# Patient Record
Sex: Male | Born: 1970 | Race: Black or African American | Hispanic: No | Smoking: Former smoker
Health system: Southern US, Community
[De-identification: ages and names within clinical notes are randomized; demographics above are authoritative.]

## PROBLEM LIST (undated history)

## (undated) DIAGNOSIS — D869 Sarcoidosis, unspecified: Secondary | ICD-10-CM

## (undated) DIAGNOSIS — G4733 Obstructive sleep apnea (adult) (pediatric): Secondary | ICD-10-CM

## (undated) DIAGNOSIS — E669 Obesity, unspecified: Secondary | ICD-10-CM

---

## 2015-09-02 ENCOUNTER — Observation Stay (HOSPITAL_COMMUNITY): Payer: Non-veteran care | Admitting: Anesthesiology

## 2015-09-02 ENCOUNTER — Observation Stay (HOSPITAL_COMMUNITY)
Admission: EM | Admit: 2015-09-02 | Discharge: 2015-09-03 | Disposition: A | Discharge status: Home / Self Care | Payer: Non-veteran care | Attending: Surgery | Admitting: Surgery

## 2015-09-02 ENCOUNTER — Encounter (HOSPITAL_COMMUNITY)
Admission: EM | Disposition: A | Discharge status: Home / Self Care | Payer: Self-pay | Source: Home / Self Care | Attending: Emergency Medicine

## 2015-09-02 ENCOUNTER — Emergency Department (HOSPITAL_COMMUNITY): Payer: Non-veteran care

## 2015-09-02 ENCOUNTER — Encounter (HOSPITAL_COMMUNITY): Payer: Self-pay

## 2015-09-02 DIAGNOSIS — Z791 Long term (current) use of non-steroidal anti-inflammatories (NSAID): Secondary | ICD-10-CM | POA: Insufficient documentation

## 2015-09-02 DIAGNOSIS — Z6841 Body Mass Index (BMI) 40.0 and over, adult: Secondary | ICD-10-CM | POA: Insufficient documentation

## 2015-09-02 DIAGNOSIS — R109 Unspecified abdominal pain: Secondary | ICD-10-CM | POA: Diagnosis present

## 2015-09-02 DIAGNOSIS — Z7952 Long term (current) use of systemic steroids: Secondary | ICD-10-CM | POA: Insufficient documentation

## 2015-09-02 DIAGNOSIS — E669 Obesity, unspecified: Secondary | ICD-10-CM | POA: Diagnosis present

## 2015-09-02 DIAGNOSIS — G4733 Obstructive sleep apnea (adult) (pediatric): Secondary | ICD-10-CM | POA: Diagnosis not present

## 2015-09-02 DIAGNOSIS — Z87891 Personal history of nicotine dependence: Secondary | ICD-10-CM | POA: Insufficient documentation

## 2015-09-02 DIAGNOSIS — K352 Acute appendicitis with generalized peritonitis, without abscess: Secondary | ICD-10-CM

## 2015-09-02 DIAGNOSIS — K3580 Unspecified acute appendicitis: Secondary | ICD-10-CM | POA: Diagnosis present

## 2015-09-02 DIAGNOSIS — D869 Sarcoidosis, unspecified: Secondary | ICD-10-CM | POA: Diagnosis not present

## 2015-09-02 HISTORY — DX: Obesity, unspecified: E66.9

## 2015-09-02 HISTORY — DX: Sarcoidosis, unspecified: D86.9

## 2015-09-02 HISTORY — PX: LAPAROSCOPIC APPENDECTOMY: SHX408

## 2015-09-02 HISTORY — DX: Obstructive sleep apnea (adult) (pediatric): G47.33

## 2015-09-02 LAB — CBC WITH DIFFERENTIAL/PLATELET
Basophils Absolute: 0 10*3/uL (ref 0.0–0.1)
Basophils Relative: 0 %
EOS PCT: 0 %
Eosinophils Absolute: 0 10*3/uL (ref 0.0–0.7)
HEMATOCRIT: 32.8 % — AB (ref 39.0–52.0)
Hemoglobin: 10.9 g/dL — ABNORMAL LOW (ref 13.0–17.0)
LYMPHS ABS: 0.5 10*3/uL — AB (ref 0.7–4.0)
LYMPHS PCT: 8 %
MCH: 24.2 pg — AB (ref 26.0–34.0)
MCHC: 33.2 g/dL (ref 30.0–36.0)
MCV: 72.7 fL — AB (ref 78.0–100.0)
MONO ABS: 0.5 10*3/uL (ref 0.1–1.0)
MONOS PCT: 7 %
NEUTROS ABS: 5.6 10*3/uL (ref 1.7–7.7)
Neutrophils Relative %: 85 %
PLATELETS: 136 10*3/uL — AB (ref 150–400)
RBC: 4.51 MIL/uL (ref 4.22–5.81)
RDW: 18.1 % — AB (ref 11.5–15.5)
WBC: 6.6 10*3/uL (ref 4.0–10.5)

## 2015-09-02 LAB — COMPREHENSIVE METABOLIC PANEL
ALBUMIN: 4 g/dL (ref 3.5–5.0)
ALT: 25 U/L (ref 17–63)
AST: 26 U/L (ref 15–41)
Alkaline Phosphatase: 60 U/L (ref 38–126)
Anion gap: 10 (ref 5–15)
BILIRUBIN TOTAL: 1.4 mg/dL — AB (ref 0.3–1.2)
BUN: 13 mg/dL (ref 6–20)
CHLORIDE: 106 mmol/L (ref 101–111)
CO2: 21 mmol/L — ABNORMAL LOW (ref 22–32)
CREATININE: 1.12 mg/dL (ref 0.61–1.24)
Calcium: 9.3 mg/dL (ref 8.9–10.3)
GFR calc Af Amer: >60 mL/min (ref >60)
GLUCOSE: 165 mg/dL — AB (ref 65–99)
POTASSIUM: 3.9 mmol/L (ref 3.5–5.1)
Sodium: 137 mmol/L (ref 135–145)
Total Protein: 7.4 g/dL (ref 6.5–8.1)

## 2015-09-02 LAB — URINALYSIS, ROUTINE W REFLEX MICROSCOPIC
Bilirubin Urine: NEGATIVE
GLUCOSE, UA: NEGATIVE mg/dL
HGB URINE DIPSTICK: NEGATIVE
Ketones, ur: NEGATIVE mg/dL
LEUKOCYTES UA: NEGATIVE
Nitrite: NEGATIVE
PH: 7 (ref 5.0–8.0)
PROTEIN: NEGATIVE mg/dL
Specific Gravity, Urine: 1.026 (ref 1.005–1.030)

## 2015-09-02 LAB — LIPASE, BLOOD: LIPASE: 29 U/L (ref 11–51)

## 2015-09-02 SURGERY — APPENDECTOMY, LAPAROSCOPIC
Anesthesia: General | Site: Abdomen

## 2015-09-02 MED ORDER — ONDANSETRON 4 MG PO TBDP: 4.0000 mg | ORAL_TABLET | Freq: Four times a day (QID) | ORAL | Status: DC | PRN

## 2015-09-02 MED ORDER — SUGAMMADEX SODIUM 500 MG/5ML IV SOLN
INTRAVENOUS | Status: AC
  Filled 2015-09-02: qty 5

## 2015-09-02 MED ORDER — PIPERACILLIN-TAZOBACTAM 3.375 G IVPB
3.3750 g | Freq: Three times a day (TID) | INTRAVENOUS | Status: DC
  Administered 2015-09-02 – 2015-09-03 (×3): 3.375 g via INTRAVENOUS
  Filled 2015-09-02 (×4): qty 50

## 2015-09-02 MED ORDER — SODIUM CHLORIDE 0.9 % IV SOLN
10000.0000 ug | INTRAVENOUS | Status: DC | PRN
  Administered 2015-09-02 (×2): 80 ug via INTRAVENOUS
  Administered 2015-09-02: 120 ug via INTRAVENOUS
  Administered 2015-09-02: 80 ug via INTRAVENOUS
  Administered 2015-09-02: 120 ug via INTRAVENOUS

## 2015-09-02 MED ORDER — MIDAZOLAM HCL 2 MG/2ML IJ SOLN
INTRAMUSCULAR | Status: AC
  Filled 2015-09-02: qty 2

## 2015-09-02 MED ORDER — ONDANSETRON HCL 4 MG/2ML IJ SOLN
4.0000 mg | Freq: Four times a day (QID) | INTRAMUSCULAR | Status: DC | PRN
  Administered 2015-09-02: 4 mg via INTRAVENOUS

## 2015-09-02 MED ORDER — SUCCINYLCHOLINE CHLORIDE 20 MG/ML IJ SOLN
INTRAMUSCULAR | Status: AC
  Filled 2015-09-02: qty 1

## 2015-09-02 MED ORDER — DEXAMETHASONE SODIUM PHOSPHATE 4 MG/ML IJ SOLN
INTRAMUSCULAR | Status: DC | PRN
  Administered 2015-09-02: 8 mg via INTRAVENOUS

## 2015-09-02 MED ORDER — LIDOCAINE HCL (CARDIAC) 20 MG/ML IV SOLN
INTRAVENOUS | Status: DC | PRN
  Administered 2015-09-02: 100 mg via INTRAVENOUS

## 2015-09-02 MED ORDER — PIPERACILLIN-TAZOBACTAM 3.375 G IVPB
3.3750 g | Freq: Three times a day (TID) | INTRAVENOUS | Status: DC
  Administered 2015-09-02: 3.375 g via INTRAVENOUS
  Filled 2015-09-02: qty 50

## 2015-09-02 MED ORDER — SODIUM CHLORIDE 0.9 % IV SOLN
INTRAVENOUS | Status: DC
  Administered 2015-09-02: 09:00:00 via INTRAVENOUS

## 2015-09-02 MED ORDER — PROPOFOL 10 MG/ML IV BOLUS
INTRAVENOUS | Status: AC
  Filled 2015-09-02: qty 20

## 2015-09-02 MED ORDER — METHOCARBAMOL 500 MG PO TABS
500.0000 mg | ORAL_TABLET | Freq: Three times a day (TID) | ORAL | Status: DC | PRN
  Administered 2015-09-02: 500 mg via ORAL
  Filled 2015-09-02: qty 1

## 2015-09-02 MED ORDER — ONDANSETRON HCL 4 MG/2ML IJ SOLN
4.0000 mg | Freq: Once | INTRAMUSCULAR | Status: AC
  Administered 2015-09-02: 4 mg via INTRAVENOUS
  Filled 2015-09-02: qty 2

## 2015-09-02 MED ORDER — PANTOPRAZOLE SODIUM 40 MG IV SOLR
40.0000 mg | Freq: Every day | INTRAVENOUS | Status: DC
  Administered 2015-09-02: 40 mg via INTRAVENOUS
  Filled 2015-09-02: qty 40

## 2015-09-02 MED ORDER — LIDOCAINE HCL (CARDIAC) 20 MG/ML IV SOLN
INTRAVENOUS | Status: AC
  Filled 2015-09-02: qty 5

## 2015-09-02 MED ORDER — OXYCODONE-ACETAMINOPHEN 5-325 MG PO TABS
1.0000 | ORAL_TABLET | ORAL | Status: DC | PRN
  Filled 2015-09-02: qty 2

## 2015-09-02 MED ORDER — FENTANYL CITRATE (PF) 100 MCG/2ML IJ SOLN
INTRAMUSCULAR | Status: DC | PRN
  Administered 2015-09-02 (×2): 100 ug via INTRAVENOUS
  Administered 2015-09-02: 50 ug via INTRAVENOUS

## 2015-09-02 MED ORDER — EPHEDRINE SULFATE 50 MG/ML IJ SOLN
INTRAMUSCULAR | Status: AC
  Filled 2015-09-02: qty 1

## 2015-09-02 MED ORDER — HYDROMORPHONE HCL 1 MG/ML IJ SOLN
1.0000 mg | Freq: Once | INTRAMUSCULAR | Status: AC
  Administered 2015-09-02: 1 mg via INTRAVENOUS
  Filled 2015-09-02: qty 1

## 2015-09-02 MED ORDER — FENTANYL CITRATE (PF) 250 MCG/5ML IJ SOLN
INTRAMUSCULAR | Status: AC
  Filled 2015-09-02: qty 5

## 2015-09-02 MED ORDER — SUGAMMADEX SODIUM 500 MG/5ML IV SOLN
INTRAVENOUS | Status: DC | PRN
  Administered 2015-09-02: 310 mg via INTRAVENOUS

## 2015-09-02 MED ORDER — IOPAMIDOL (ISOVUE-300) INJECTION 61%
INTRAVENOUS | Status: AC
  Administered 2015-09-02: 100 mL via INTRAVENOUS
  Filled 2015-09-02: qty 100

## 2015-09-02 MED ORDER — ONDANSETRON HCL 4 MG/2ML IJ SOLN
INTRAMUSCULAR | Status: AC
  Filled 2015-09-02: qty 2

## 2015-09-02 MED ORDER — ONDANSETRON HCL 4 MG/2ML IJ SOLN
4.0000 mg | Freq: Once | INTRAMUSCULAR | Status: DC | PRN
Start: 2015-09-02 — End: 2015-09-02

## 2015-09-02 MED ORDER — PROPOFOL 10 MG/ML IV BOLUS
INTRAVENOUS | Status: DC | PRN
  Administered 2015-09-02: 200 mg via INTRAVENOUS

## 2015-09-02 MED ORDER — 0.9 % SODIUM CHLORIDE (POUR BTL) OPTIME
TOPICAL | Status: DC | PRN
  Administered 2015-09-02: 1000 mL

## 2015-09-02 MED ORDER — FENTANYL CITRATE (PF) 100 MCG/2ML IJ SOLN
100.0000 ug | Freq: Once | INTRAMUSCULAR | Status: AC
  Administered 2015-09-02: 100 ug via INTRAVENOUS
  Filled 2015-09-02 (×2): qty 2

## 2015-09-02 MED ORDER — FENTANYL CITRATE (PF) 100 MCG/2ML IJ SOLN: 25.0000 ug | INTRAMUSCULAR | Status: DC | PRN

## 2015-09-02 MED ORDER — SODIUM CHLORIDE 0.9 % IR SOLN
Status: DC | PRN
  Administered 2015-09-02: 1000 mL

## 2015-09-02 MED ORDER — ROCURONIUM BROMIDE 100 MG/10ML IV SOLN
INTRAVENOUS | Status: DC | PRN
  Administered 2015-09-02: 20 mg via INTRAVENOUS
  Administered 2015-09-02: 10 mg via INTRAVENOUS
  Administered 2015-09-02: 50 mg via INTRAVENOUS

## 2015-09-02 MED ORDER — ROCURONIUM BROMIDE 50 MG/5ML IV SOLN
INTRAVENOUS | Status: AC
  Filled 2015-09-02: qty 1

## 2015-09-02 MED ORDER — LACTATED RINGERS IV SOLN
INTRAVENOUS | Status: DC | PRN
  Administered 2015-09-02 (×2): via INTRAVENOUS

## 2015-09-02 MED ORDER — HYDROMORPHONE HCL 1 MG/ML IJ SOLN: 1.0000 mg | INTRAMUSCULAR | Status: DC | PRN

## 2015-09-02 MED ORDER — BUPIVACAINE-EPINEPHRINE (PF) 0.5% -1:200000 IJ SOLN
INTRAMUSCULAR | Status: AC
  Filled 2015-09-02: qty 30

## 2015-09-02 MED ORDER — ENOXAPARIN SODIUM 80 MG/0.8ML ~~LOC~~ SOLN
0.5000 mg/kg | SUBCUTANEOUS | Status: DC
  Administered 2015-09-03: 75 mg via SUBCUTANEOUS
  Filled 2015-09-02: qty 0.8

## 2015-09-02 MED ORDER — SUCCINYLCHOLINE 20MG/ML (10ML) SYRINGE FOR MEDFUSION PUMP - OPTIME
INTRAMUSCULAR | Status: DC | PRN
  Administered 2015-09-02: 140 mg via INTRAVENOUS

## 2015-09-02 MED ORDER — SODIUM CHLORIDE 0.9 % IV SOLN
INTRAVENOUS | Status: DC
  Administered 2015-09-02 – 2015-09-03 (×2): via INTRAVENOUS

## 2015-09-02 MED ORDER — BUPIVACAINE-EPINEPHRINE 0.5% -1:200000 IJ SOLN
INTRAMUSCULAR | Status: DC | PRN
  Administered 2015-09-02: 6 mL

## 2015-09-02 MED ORDER — PHENYLEPHRINE HCL 10 MG/ML IJ SOLN
INTRAMUSCULAR | Status: AC
  Filled 2015-09-02: qty 1

## 2015-09-02 MED ORDER — FOLIC ACID 1 MG PO TABS
1.0000 mg | ORAL_TABLET | Freq: Every day | ORAL | Status: DC
  Administered 2015-09-03: 1 mg via ORAL
  Filled 2015-09-02: qty 1

## 2015-09-02 SURGICAL SUPPLY — 40 items
APPLIER CLIP ROT 10 11.4 M/L (STAPLE)
BLADE SURG ROTATE 9660 (MISCELLANEOUS) IMPLANT
CANISTER SUCTION 2500CC (MISCELLANEOUS) ×3 IMPLANT
CHLORAPREP W/TINT 26ML (MISCELLANEOUS) ×3 IMPLANT
CLIP APPLIE ROT 10 11.4 M/L (STAPLE) IMPLANT
COVER SURGICAL LIGHT HANDLE (MISCELLANEOUS) ×3 IMPLANT
CUTTER FLEX LINEAR 45M (STAPLE) ×3 IMPLANT
DRAPE WARM FLUID 44X44 (DRAPE) ×3 IMPLANT
ELECT REM PT RETURN 9FT ADLT (ELECTROSURGICAL) ×3
ELECTRODE REM PT RTRN 9FT ADLT (ELECTROSURGICAL) ×1 IMPLANT
ENDOLOOP SUT PDS II  0 18 (SUTURE)
ENDOLOOP SUT PDS II 0 18 (SUTURE) IMPLANT
GLOVE BIO SURGEON STRL SZ8 (GLOVE) ×3 IMPLANT
GLOVE BIOGEL PI IND STRL 8 (GLOVE) ×1 IMPLANT
GLOVE BIOGEL PI INDICATOR 8 (GLOVE) ×2
GOWN STRL REUS W/ TWL LRG LVL3 (GOWN DISPOSABLE) ×2 IMPLANT
GOWN STRL REUS W/ TWL XL LVL3 (GOWN DISPOSABLE) ×1 IMPLANT
GOWN STRL REUS W/TWL LRG LVL3 (GOWN DISPOSABLE) ×4
GOWN STRL REUS W/TWL XL LVL3 (GOWN DISPOSABLE) ×2
KIT BASIN OR (CUSTOM PROCEDURE TRAY) ×3 IMPLANT
KIT ROOM TURNOVER OR (KITS) ×3 IMPLANT
LIQUID BAND (GAUZE/BANDAGES/DRESSINGS) ×3 IMPLANT
NS IRRIG 1000ML POUR BTL (IV SOLUTION) ×3 IMPLANT
PAD ARMBOARD 7.5X6 YLW CONV (MISCELLANEOUS) ×6 IMPLANT
POUCH SPECIMEN RETRIEVAL 10MM (ENDOMECHANICALS) ×3 IMPLANT
RELOAD STAPLE TA45 3.5 REG BLU (ENDOMECHANICALS) ×3 IMPLANT
SCALPEL HARMONIC ACE (MISCELLANEOUS) ×3 IMPLANT
SCISSORS LAP 5X35 DISP (ENDOMECHANICALS) ×3 IMPLANT
SET IRRIG TUBING LAPAROSCOPIC (IRRIGATION / IRRIGATOR) ×3 IMPLANT
SPECIMEN JAR SMALL (MISCELLANEOUS) ×3 IMPLANT
SUT MNCRL AB 4-0 PS2 18 (SUTURE) ×3 IMPLANT
SUT MON AB 4-0 PC3 18 (SUTURE) ×3 IMPLANT
SYR CONTROL 10ML LL (SYRINGE) ×3 IMPLANT
TOWEL OR 17X24 6PK STRL BLUE (TOWEL DISPOSABLE) ×3 IMPLANT
TOWEL OR 17X26 10 PK STRL BLUE (TOWEL DISPOSABLE) ×3 IMPLANT
TRAY FOLEY CATH 16FR SILVER (SET/KITS/TRAYS/PACK) ×3 IMPLANT
TRAY LAPAROSCOPIC MC (CUSTOM PROCEDURE TRAY) ×3 IMPLANT
TROCAR XCEL BLADELESS 5X75MML (TROCAR) ×6 IMPLANT
TROCAR XCEL BLUNT TIP 100MML (ENDOMECHANICALS) ×3 IMPLANT
TUBING INSUFFLATION (TUBING) ×3 IMPLANT

## 2015-09-02 NOTE — Op Note (Signed)
NAME:  Eric Roth, Eric Roth NO.:  [id number]  MEDICAL RECORD NO.:  [id number]  LOCATION:  6N13C                        FACILITY:  [facility]  PHYSICIAN:  Eric Roth, M.D.DATE OF BIRTH:  [birth year]  DATE OF PROCEDURE:  09/02/2015 DATE OF DISCHARGE:                              OPERATIVE REPORT   PREOPERATIVE DIAGNOSIS:  Acute appendicitis with perforation.  POSTOPERATIVE DIAGNOSIS:  Acute appendicitis with perforation.  PROCEDURE:  Laparoscopic appendectomy.  SURGEON:  Eric Roth, M.D.  ANESTHESIA:  General endotracheal anesthesia with 0.25% Sensorcaine local with epinephrine.  EBL:  Minimal.  SPECIMEN:  Fragments of the appendix to pathology.  DRAINS:  None.  INDICATIONS FOR PROCEDURE:  The patient is a 45 year old male, who presents with acute appendicitis to the emergency room.  He has multiple comorbidities including morbid obesity, sarcoidosis, and steroid dependence.  After reviewing a CT scan, he appeared to have evidence of micro perforation.  Given steroid dependence, I did not feel non- operative management would be successful since historically this does not work on steroid-dependent patients.  I recommend a laparoscopy to him.  The risks of bleeding, infection, organ injury, ureter injury, colon injury, bowel injury, injury to the bladder, major bleeding, death, DVT, exacerbation of underlying medical problems, need for open surgery, the need for possible hernia repair to undergo with open surgery, and the need for other treatments and/or procedures discussed. He voiced understanding and agreed to proceed.  DESCRIPTION OF PROCEDURE:  The patient was met in the holding area. Questions were answered.  He was examined and CT scans reviewed.  He was taken back to the operating room and placed supine on the OR table. After induction of general anesthesia, left arm was tucked and the abdomen was prepped and draped in sterile fashion.   Time-out was done. A Foley catheter was placed in sterile condition.  His abdomen is then prepped and draped in sterile fashion.  After time-out was done, incision was made through the umbilicus.  Dissection was carried down to a small hernia defect.  This measured about 5 mm.  I made this wider and placed a pursestring suture 0 Vicryl around this.  A 12 mm Hasson port was placed under direct vision.  Pneumoperitoneum was created to 15 mmHg of CO2 pressure.  Laparoscope was placed.  A 5 mm scope was used.  A 4- quadrant laparoscopy was performed.  He had hepato and splenomegaly. There was significant right lower quadrant inflammation.  Two other 5 mm ports were placed, one in the upper abdominal midline the 2nd below the umbilical port by about 6 cm.  The appendix was appeared to be perforated, but I could see the base.  The small bowel was densely adherent to this area and carefully adhesiolysis was performed using sharp dissection with care taken not to injure the bowel.  He had very thin flimsy tissues.  We saw no evidence of bowel injury with this adhesiolysis.  The appendix was grasped was perforated in its middle.  I was able to dissect the tip out, but doing so this separated from where the appendix had perforated.  I placed this in an EndoCatch bag and extracted.  There was no evidence of pus though stool spillage.  I then finished dissecting out the base, I was able to get a GIA 45 stapler across the base of the appendix at the cecum and fired this to control the base.  The remainder of the appendix was dissected out from the mesoappendix using the Harmonic scalpel with care taken to stay well away from the cecum and the terminal ileum.  The adhesions of the terminal ileum were taken down with sharp dissection, straighten this out to prevent any issues postop.  After this was done, irrigation was used.  There were no signs of any bleeding from the mesoappendix nor the appendiceal  stump.  There was no leakage from the appendiceal stump. Four-quadrant laparoscopy performed which showed no evidence of bowel or bladder injury.  Our ports were then removed after removing the irrigation.  The umbilical port site was closed with pursestring suture 0 Vicryl and 4-0 Monocryl was used to close skin incisions.  Liquid adhesive applied.  All final counts found to be correct.  The patient was then awoke, extubated, and taken to recovery in satisfactory condition.     Eric Roth, M.D.     TAC/MEDQ  D:  09/02/2015  T:  09/02/2015  Job:  [id number]

## 2015-09-02 NOTE — ED Notes (Signed)
Pt here for diffuse abd pain that started around 0230 this morning. Pt reports some nausea and vomiting since 0230.

## 2015-09-02 NOTE — H&P (Signed)
Eric Roth is an 45 y.o. male.    Chief Complaint: abdominal pain, nausea and vomiting  HPI: Pt started having pain early this AM.  Nothing made it better. Afebrile, VSS. Glucose up on CMP No CBC.  CT scan shows Acute appendicitis with 1 cm appendicolith and possible early perforation. No drainable fluid collection/abscess identified. 2. Bibasilar lung nodularity, mild abdominal lymphadenopathy, and splenomegaly compatible with known sarcoidosis.  Past Medical History  Diagnosis Date  . Sarcoidosis ([place])     History reviewed. No pertinent past surgical history.  History reviewed. No pertinent family history. Social History:  reports that he has quit smoking. He does not have any smokeless tobacco history on file. He reports that he drinks alcohol. He reports that he does not use illicit drugs.  Allergies:  Allergies  Allergen Reactions  . Other Swelling    Benzoperoxide  tobacco - quit age 74 Etoh - Social Drugs-  None Single  works as a Merchant navy officer at [employer]  Prior to Admission medications   Medication Sig Start Date End Date Taking? Authorizing Provider  acetaminophen (TYLENOL) 325 MG tablet Take 975 mg by mouth every 6 (six) hours as needed (pain).   Yes Historical Provider, MD  folic acid (FOLVITE) 1 MG tablet Take 1 mg by mouth daily.   Yes Historical Provider, MD  meloxicam (MOBIC) 7.5 MG tablet Take 7.5 mg by mouth daily.   Yes Historical Provider, MD  Oxycodone-Acetaminophen (PERCOCET PO) Take 1 tablet by mouth every 6 (six) hours as needed (pain).   Yes Historical Provider, MD  PREDNISONE PO Take 1 tablet by mouth daily.   Yes Historical Provider, MD  PRESCRIPTION MEDICATION Take 1 tablet by mouth once a week. Med for sarcoidosis   Yes Historical Provider, MD     Results for orders placed or performed during the hospital encounter of 09/02/15 (from the past 48 hour(s))  Comprehensive metabolic panel     Status: Abnormal   Collection Time: 09/02/15  9:06 AM   Result Value Ref Range   Sodium 137 135 - 145 mmol/L   Potassium 3.9 3.5 - 5.1 mmol/L   Chloride 106 101 - 111 mmol/L   CO2 21 (L) 22 - 32 mmol/L   Glucose, Bld 165 (H) 65 - 99 mg/dL   BUN 13 6 - 20 mg/dL   Creatinine, Ser 1.12 0.61 - 1.24 mg/dL   Calcium 9.3 8.9 - 10.3 mg/dL   Total Protein 7.4 6.5 - 8.1 g/dL   Albumin 4.0 3.5 - 5.0 g/dL   AST 26 15 - 41 U/L   ALT 25 17 - 63 U/L   Alkaline Phosphatase 60 38 - 126 U/L   Total Bilirubin 1.4 (H) 0.3 - 1.2 mg/dL   GFR calc non Af Amer >60 >60 mL/min   GFR calc Af Amer >60 >60 mL/min    Comment: (NOTE) The eGFR has been calculated using the CKD EPI equation. This calculation has not been validated in all clinical situations. eGFR's persistently <60 mL/min signify possible Chronic Kidney Disease.    Anion gap 10 5 - 15  Lipase, blood     Status: None   Collection Time: 09/02/15  9:06 AM  Result Value Ref Range   Lipase 29 11 - 51 U/L   Ct Abdomen Pelvis W Contrast  09/02/2015  CLINICAL DATA:  Nausea, vomiting, and diffuse abdominal pain. History of sarcoidosis with known splenic involvement. EXAM: CT ABDOMEN AND PELVIS WITH CONTRAST TECHNIQUE: Multidetector CT imaging of the  abdomen and pelvis was performed using the standard protocol following bolus administration of intravenous contrast. CONTRAST:  100 mL Isovue-300 COMPARISON:  None. FINDINGS: The visualized lung bases demonstrate peribronchovascular/perilymphatic nodularity and subsegmental atelectasis. Small subcarinal lymph nodes measure up to 1.4 cm in short axis. The spleen is enlarged, measuring 20 cm in length. No focal splenic lesion is identified. The liver, gallbladder, adrenal glands, kidneys, and pancreas are unremarkable. There is no evidence of bowel obstruction. There is a 1 cm appendicolith within the proximal to midportion of the appendix, with the more distal portion of the appendix being dilated with poor delineation of its walls and moderate surrounding inflammatory  change. No organized, drainable fluid collection or definite extraluminal gas is identified. A few subcentimeter lymph nodes are noted in the right lower quadrant. Gastrohepatic ligaments measure up to 1.3 cm in short axis. Periportal lymph nodes measure up to 1.3 cm, and lymph nodes in the splenic hilum measure up to 1.1 cm. Small periaortic and aortocaval lymph nodes are also noted, with a left periaortic lymph node below the left renal vein measuring 1.3 cm in short axis. Bladder and prostate are unremarkable. Old left-sided rib fractures are noted. No acute osseous abnormality is identified. IMPRESSION: 1. Acute appendicitis with 1 cm appendicolith and possible early perforation. No drainable fluid collection/abscess identified. 2. Bibasilar lung nodularity, mild abdominal lymphadenopathy, and splenomegaly compatible with known sarcoidosis. These results were called by telephone at the time of interpretation on 09/02/2015 at 10:52 am to Dr. Carmin Muskrat , who verbally acknowledged these results. Electronically Signed   By: Logan Bores M.D.   On: 09/02/2015 10:54    Review of Systems  Constitutional: Positive for fever.  HENT: Negative.   Eyes: Negative.   Respiratory: Positive for cough.   Cardiovascular: Negative.   Gastrointestinal: Positive for heartburn, nausea, vomiting and abdominal pain. Negative for diarrhea, constipation and blood in stool.  Genitourinary: Negative.   Musculoskeletal: Positive for back pain.  Skin: Negative.   Neurological: Negative.   Endo/Heme/Allergies: Negative.   Psychiatric/Behavioral: Negative.     Blood pressure 123/74, pulse 74, temperature 97.8 F (36.6 C), temperature source Oral, resp. rate 16, height 6' 1"  (1.854 m), weight 154.223 kg (340 lb), SpO2 100 %. Physical Exam  Constitutional: He is oriented to person, place, and time. He appears well-developed and well-nourished. No distress.  HENT:  Head: Normocephalic and atraumatic.  Nose: Nose  normal.  Eyes: Conjunctivae and EOM are normal. Right eye exhibits no discharge. Left eye exhibits no discharge. No scleral icterus.  Neck: Normal range of motion. Neck supple. No JVD present. No tracheal deviation present. No thyromegaly present.  Cardiovascular: Normal rate, regular rhythm, normal heart sounds and intact distal pulses.  Exam reveals no gallop.   No murmur heard. Respiratory: Effort normal and breath sounds normal. No respiratory distress. He has no wheezes. He has no rales. He exhibits no tenderness.  GI: Soft. He exhibits no distension and no mass. There is tenderness (Pain all over, but pain on exam RLQ). There is no rebound and no guarding.  Musculoskeletal: He exhibits no edema or tenderness.  Lymphadenopathy:    He has no cervical adenopathy.  Neurological: He is alert and oriented to person, place, and time.  Skin: Skin is warm and dry. Rash noted. He is not diaphoretic. No erythema. No pallor.  Psychiatric: He has a normal mood and affect. His behavior is normal. Judgment and thought content normal.     Assessment/Plan Acute appendicitis Sarcoid  on prednisone- he does not know his dose BMI 47.1    Plan:  We are starting antibiotics, he will go to the OR this afternoon.    Ashunti Schofield, PA-C 09/02/2015, 11:15 AM

## 2015-09-02 NOTE — ED Notes (Signed)
MD at bedside. 

## 2015-09-02 NOTE — ED Provider Notes (Signed)
CSN: [id number]     Arrival date & time 09/02/15  [id number] History   First MD Initiated Contact with Patient 09/02/15 [phone]     Chief Complaint  Patient presents with  . Abdominal Pain     (Consider location/radiation/quality/duration/timing/severity/associated sxs/prior Treatment) HPI Patient is a concern of abdominal pain. Pain began about 6 hours ago, without clear precipitant. Since onset pain has been diffuse, sore, severe, with no relief from anything. There is associated nausea, vomiting, no bowel movements since onset of pain. No fever, though the patient describes warm and cold sensation. Patient was well prior to the onset of symptoms. Patient has a notable history of sarcoidosis, with known involvement in the spleen.  Past Medical History  Diagnosis Date  . Sarcoidosis ([facility])    History reviewed. No pertinent past surgical history. History reviewed. No pertinent family history. Social History  Substance Use Topics  . Smoking status: Former Games developermoker  . Smokeless tobacco: None  . Alcohol Use: Yes    Review of Systems  Constitutional:       Per HPI, otherwise negative  HENT:       Per HPI, otherwise negative  Respiratory:       Per HPI, otherwise negative  Cardiovascular:       Per HPI, otherwise negative  Gastrointestinal: Positive for nausea and vomiting.  Endocrine:       Negative aside from HPI  Genitourinary:       Neg aside from HPI   Musculoskeletal:       Per HPI, otherwise negative  Skin: Negative.   Neurological: Negative for syncope.      Allergies  Other  Home Medications   Prior to Admission medications   Medication Sig Start Date End Date Taking? Authorizing Provider  acetaminophen (TYLENOL) 325 MG tablet Take 975 mg by mouth every 6 (six) hours as needed (pain).   Yes Historical Provider, MD  folic acid (FOLVITE) 1 MG tablet Take 1 mg by mouth daily.   Yes Historical Provider, MD  meloxicam (MOBIC) 7.5 MG tablet Take 7.5 mg by mouth daily.    Yes Historical Provider, MD  Oxycodone-Acetaminophen (PERCOCET PO) Take 1 tablet by mouth every 6 (six) hours as needed (pain).   Yes Historical Provider, MD  PREDNISONE PO Take 1 tablet by mouth daily.   Yes Historical Provider, MD  PRESCRIPTION MEDICATION Take 1 tablet by mouth once a week. Med for sarcoidosis   Yes Historical Provider, MD   BP 123/74 mmHg  Pulse 74  Temp(Src) 97.8 F (36.6 C) (Oral)  Resp 16  Ht 6\' 1"  (1.854 m)  Wt 340 lb (154.223 kg)  BMI 44.87 kg/m2  SpO2 100% Physical Exam  Constitutional: He is oriented to person, place, and time. He appears well-developed. No distress.  HENT:  Head: Normocephalic and atraumatic.  Eyes: Conjunctivae and EOM are normal.  Cardiovascular: Normal rate and regular rhythm.   Pulmonary/Chest: Effort normal. No stridor. No respiratory distress.  Abdominal: He exhibits no distension. There is tenderness. There is guarding.  Musculoskeletal: He exhibits no edema.  Neurological: He is alert and oriented to person, place, and time.  Skin: Skin is warm and dry.  Psychiatric: He has a normal mood and affect.  Nursing note and vitals reviewed.   ED Course  Procedures (including critical care time) Labs Review Labs Reviewed  COMPREHENSIVE METABOLIC PANEL - Abnormal; Notable for the following:    CO2 21 (*)    Glucose, Bld 165 (*)  Total Bilirubin 1.4 (*)    All other components within normal limits  CBC WITH DIFFERENTIAL/PLATELET - Abnormal; Notable for the following:    Hemoglobin 10.9 (*)    HCT 32.8 (*)    MCV 72.7 (*)    MCH 24.2 (*)    RDW 18.1 (*)    Platelets 136 (*)    Lymphs Abs 0.5 (*)    All other components within normal limits  LIPASE, BLOOD  CBC WITH DIFFERENTIAL/PLATELET  URINALYSIS, ROUTINE W REFLEX MICROSCOPIC (NOT AT [facility])    Imaging Review Ct Abdomen Pelvis W Contrast  09/02/2015  CLINICAL DATA:  Nausea, vomiting, and diffuse abdominal pain. History of sarcoidosis with known splenic involvement.  EXAM: CT ABDOMEN AND PELVIS WITH CONTRAST TECHNIQUE: Multidetector CT imaging of the abdomen and pelvis was performed using the standard protocol following bolus administration of intravenous contrast. CONTRAST:  100 mL Isovue-300 COMPARISON:  None. FINDINGS: The visualized lung bases demonstrate peribronchovascular/perilymphatic nodularity and subsegmental atelectasis. Small subcarinal lymph nodes measure up to 1.4 cm in short axis. The spleen is enlarged, measuring 20 cm in length. No focal splenic lesion is identified. The liver, gallbladder, adrenal glands, kidneys, and pancreas are unremarkable. There is no evidence of bowel obstruction. There is a 1 cm appendicolith within the proximal to midportion of the appendix, with the more distal portion of the appendix being dilated with poor delineation of its walls and moderate surrounding inflammatory change. No organized, drainable fluid collection or definite extraluminal gas is identified. A few subcentimeter lymph nodes are noted in the right lower quadrant. Gastrohepatic ligaments measure up to 1.3 cm in short axis. Periportal lymph nodes measure up to 1.3 cm, and lymph nodes in the splenic hilum measure up to 1.1 cm. Small periaortic and aortocaval lymph nodes are also noted, with a left periaortic lymph node below the left renal vein measuring 1.3 cm in short axis. Bladder and prostate are unremarkable. Old left-sided rib fractures are noted. No acute osseous abnormality is identified. IMPRESSION: 1. Acute appendicitis with 1 cm appendicolith and possible early perforation. No drainable fluid collection/abscess identified. 2. Bibasilar lung nodularity, mild abdominal lymphadenopathy, and splenomegaly compatible with known sarcoidosis. These results were called by telephone at the time of interpretation on 09/02/2015 at 10:52 am to Dr. Gerhard Munch , who verbally acknowledged these results. Electronically Signed   By: Sebastian Ache M.D.   On: 09/02/2015 10:54    I have personally reviewed and evaluated these images and lab results as part of my medical decision-making.  On repeat exam the patient continues to have pain, though diminished since arrival.  MDM  Previously well male aside from history of sarcoidosis presents with new abdominal pain. Here the patient is awake and alert, though with substantial pain. Patient is found to have acute appendicitis, with possible early perforation. I discussed this case with our surgical colleagues. Patient required substantial pain medication, was admitted for further evaluation and management.  Gerhard Munch, MD 09/02/15 [phone]

## 2015-09-02 NOTE — Anesthesia Preprocedure Evaluation (Addendum)
Anesthesia Evaluation  Patient identified by MRN, date of birth, ID band Patient awake    Reviewed: Allergy & Precautions, NPO status , Patient's Chart, lab work & pertinent test results  History of Anesthesia Complications Negative for: history of anesthetic complications  Airway Mallampati: III  TM Distance: >3 FB Neck ROM: Full    Dental no notable dental hx. (+) Dental Advisory Given   Pulmonary sleep apnea and Continuous Positive Airway Pressure Ventilation , former smoker,  Sarcoidosis, currently on prednisone   Pulmonary exam normal breath sounds clear to auscultation       Cardiovascular negative cardio ROS Normal cardiovascular exam Rhythm:Regular Rate:Normal     Neuro/Psych negative neurological ROS  negative psych ROS   GI/Hepatic negative GI ROS, Neg liver ROS,   Endo/Other  Morbid obesity  Renal/GU negative Renal ROS  negative genitourinary   Musculoskeletal negative musculoskeletal ROS (+)   Abdominal   Peds negative pediatric ROS (+)  Hematology negative hematology ROS (+)   Anesthesia Other Findings   Reproductive/Obstetrics negative OB ROS                           Anesthesia Physical Anesthesia Plan  ASA: III  Anesthesia Plan: General   Post-op Pain Management:    Induction: Intravenous, Rapid sequence and Cricoid pressure planned  Airway Management Planned: Oral ETT and Video Laryngoscope Planned  Additional Equipment:   Intra-op Plan:   Post-operative Plan: Extubation in OR  Informed Consent: I have reviewed the patients History and Physical, chart, labs and discussed the procedure including the risks, benefits and alternatives for the proposed anesthesia with the patient or authorized representative who has indicated his/her understanding and acceptance.   Dental advisory given  Plan Discussed with: CRNA  Anesthesia Plan Comments:         Anesthesia Quick Evaluation

## 2015-09-02 NOTE — Transfer of Care (Signed)
Immediate Anesthesia Transfer of Care Note  Patient: Eric Roth  Procedure(s) Performed: Procedure(s): APPENDECTOMY LAPAROSCOPIC (N/A)  Patient Location: PACU  Anesthesia Type:General  Level of Consciousness: awake, alert , oriented and patient cooperative  Airway & Oxygen Therapy: Patient Spontanous Breathing and Patient connected to face mask oxygen  Post-op Assessment: Report given to RN, Post -op Vital signs reviewed and stable and Patient moving all extremities X 4  Post vital signs: Reviewed and stable  Last Vitals:  Filed Vitals:   09/02/15 1227 09/02/15 1534  BP: 129/67 143/115  Pulse: 78 96  Temp:  36.4 C  Resp: 14 18    Complications: No apparent anesthesia complications

## 2015-09-02 NOTE — ED Notes (Addendum)
Pt asleep and snoring at this time. Will reassess need for pain meds.

## 2015-09-02 NOTE — Anesthesia Procedure Notes (Signed)
Procedure Name: Intubation Date/Time: 09/02/2015 1:49 PM Performed by: Rosiland OzMEYERS, Kendal Ghazarian Pre-anesthesia Checklist: Timeout performed, Patient identified, Emergency Drugs available, Suction available and Patient being monitored Patient Re-evaluated:Patient Re-evaluated prior to inductionOxygen Delivery Method: Circle system utilized Preoxygenation: Pre-oxygenation with 100% oxygen Intubation Type: IV induction, Rapid sequence and Cricoid Pressure applied Laryngoscope Size: Glidescope and 3 Grade View: Grade I Tube type: Oral Number of attempts: 1 Airway Equipment and Method: Video-laryngoscopy and Stylet Placement Confirmation: ETT inserted through vocal cords under direct vision,  breath sounds checked- equal and bilateral and positive ETCO2 Secured at: 24 cm Tube secured with: Tape Dental Injury: Teeth and Oropharynx as per pre-operative assessment

## 2015-09-02 NOTE — Brief Op Note (Signed)
09/02/2015  3:18 PM  PATIENT:  Eric Roth  45 y.o. male  PRE-OPERATIVE DIAGNOSIS:  Appendicitis  POST-OPERATIVE DIAGNOSIS:  Appendicitis  PROCEDURE:  Procedure(s): APPENDECTOMY LAPAROSCOPIC (N/A)  SURGEON:  Surgeon(s) and Role:    * Harriette Bouillonhomas Remedios Mckone, MD - Primary      ANESTHESIA:   local and general  EBL:  Total I/O In: 1000 [I.V.:1000] Out: 580 [Urine:530; Blood:50]      LOCAL MEDICATIONS USED:  BUPIVICAINE   SPECIMEN:  Source of Specimen:  appendix  DISPOSITION OF SPECIMEN:  PATHOLOGY  COUNTS:  YES  TOURNIQUET:  * No tourniquets in log *  DICTATION: .Other Dictation: Dictation Number [id number]  PLAN OF CARE: Admit to inpatient   PATIENT DISPOSITION:  PACU - hemodynamically stable.   Delay start of Pharmacological VTE agent (>24hrs) due to surgical blood loss or risk of bleeding: no

## 2015-09-02 NOTE — Progress Notes (Signed)
Received patient from ED, lying on side.  Arousable and answers questions appropriately.  States he's having some pain, but goes off to sleep.  2 belongings bag have been to sent to PACU.   Paperwork for security items in with chart.

## 2015-09-02 NOTE — Anesthesia Postprocedure Evaluation (Signed)
Anesthesia Post Note  Patient: Eric Roth  Procedure(s) Performed: Procedure(s) (LRB): APPENDECTOMY LAPAROSCOPIC (N/A)  Patient location during evaluation: PACU Anesthesia Type: General Level of consciousness: awake and alert Pain management: pain level controlled Vital Signs Assessment: post-procedure vital signs reviewed and stable Respiratory status: spontaneous breathing, nonlabored ventilation, respiratory function stable and patient connected to nasal cannula oxygen Cardiovascular status: blood pressure returned to baseline and stable Postop Assessment: no signs of nausea or vomiting Anesthetic complications: no    Last Vitals:  Filed Vitals:   09/02/15 1615 09/02/15 1627  BP: 133/64 124/72  Pulse: 93 93  Temp:  36.4 C  Resp: 20 21    Last Pain:  Filed Vitals:   09/02/15 1628  PainSc: Asleep                 Harm Jou JENNETTE

## 2015-09-02 NOTE — ED Notes (Signed)
Eric Roth from lab called to report that lab has lost the initial CBC and it will need to be recollected.

## 2015-09-02 NOTE — ED Notes (Signed)
General surgery at bedside. 

## 2015-09-03 ENCOUNTER — Encounter: Payer: Self-pay | Admitting: General Surgery

## 2015-09-03 ENCOUNTER — Encounter (HOSPITAL_COMMUNITY): Payer: Self-pay | Admitting: General Surgery

## 2015-09-03 DIAGNOSIS — D869 Sarcoidosis, unspecified: Secondary | ICD-10-CM

## 2015-09-03 DIAGNOSIS — G4733 Obstructive sleep apnea (adult) (pediatric): Secondary | ICD-10-CM

## 2015-09-03 DIAGNOSIS — E669 Obesity, unspecified: Secondary | ICD-10-CM | POA: Diagnosis present

## 2015-09-03 HISTORY — DX: Sarcoidosis, unspecified: D86.9

## 2015-09-03 HISTORY — DX: Obesity, unspecified: E66.9

## 2015-09-03 HISTORY — DX: Obstructive sleep apnea (adult) (pediatric): G47.33

## 2015-09-03 LAB — BASIC METABOLIC PANEL
Anion gap: 9 (ref 5–15)
BUN: 10 mg/dL (ref 6–20)
CHLORIDE: 104 mmol/L (ref 101–111)
CO2: 23 mmol/L (ref 22–32)
CREATININE: 1.18 mg/dL (ref 0.61–1.24)
Calcium: 9.4 mg/dL (ref 8.9–10.3)
GFR calc Af Amer: >60 mL/min (ref >60)
GFR calc non Af Amer: >60 mL/min (ref >60)
Glucose, Bld: 124 mg/dL — ABNORMAL HIGH (ref 65–99)
Potassium: 4.2 mmol/L (ref 3.5–5.1)
SODIUM: 136 mmol/L (ref 135–145)

## 2015-09-03 LAB — CBC
HCT: 30.4 % — ABNORMAL LOW (ref 39.0–52.0)
Hemoglobin: 10 g/dL — ABNORMAL LOW (ref 13.0–17.0)
MCH: 24 pg — AB (ref 26.0–34.0)
MCHC: 32.9 g/dL (ref 30.0–36.0)
MCV: 72.9 fL — AB (ref 78.0–100.0)
PLATELETS: 123 10*3/uL — AB (ref 150–400)
RBC: 4.17 MIL/uL — ABNORMAL LOW (ref 4.22–5.81)
RDW: 18.4 % — AB (ref 11.5–15.5)
WBC: 6.7 10*3/uL (ref 4.0–10.5)

## 2015-09-03 MED ORDER — OXYCODONE-ACETAMINOPHEN 5-325 MG PO TABS: 1.0000 | ORAL_TABLET | ORAL | Status: AC | PRN

## 2015-09-03 MED ORDER — AMOXICILLIN-POT CLAVULANATE 875-125 MG PO TABS
1.0000 | ORAL_TABLET | Freq: Two times a day (BID) | ORAL | Status: DC
  Administered 2015-09-03: 1 via ORAL
  Filled 2015-09-03: qty 1

## 2015-09-03 MED ORDER — SERTRALINE HCL 100 MG PO TABS
100.0000 mg | ORAL_TABLET | Freq: Every day | ORAL | Status: DC
  Administered 2015-09-03: 100 mg via ORAL
  Filled 2015-09-03: qty 1

## 2015-09-03 MED ORDER — AMOXICILLIN-POT CLAVULANATE 875-125 MG PO TABS: 1.0000 | ORAL_TABLET | Freq: Two times a day (BID) | ORAL | Status: AC

## 2015-09-03 MED ORDER — PREDNISONE 10 MG PO TABS
10.0000 mg | ORAL_TABLET | Freq: Every day | ORAL | Status: DC
  Administered 2015-09-03: 10 mg via ORAL
  Filled 2015-09-03: qty 1

## 2015-09-03 MED ORDER — PANTOPRAZOLE SODIUM 40 MG PO TBEC: 40.0000 mg | DELAYED_RELEASE_TABLET | Freq: Every day | ORAL | Status: DC

## 2015-09-03 MED ORDER — PREDNISONE 10 MG PO TABS: 10.0000 mg | ORAL_TABLET | Freq: Every day | ORAL | Status: DC

## 2015-09-03 NOTE — Discharge Summary (Signed)
Physician Discharge Summary  Patient ID: Eric Roth MRN: [id number] DOB/AGE: 45/11/1970 45 y.o.  Admit date: 09/02/2015 Discharge date: 09/03/2015  Admission Diagnoses:  Acute appendicitis Sarcoid on prednisone/methotrexate- he does not know his dose Sleep apnea with CPAP BMI 47.1   Discharge Diagnoses:  Acute appendicitis with perforation Sarcoid on prednisone/methotrexate- he does not know his dose Sleep apnea with CPAP BMI 47.1   Principal Problem:   Acute appendicitis Active Problems:   Sarcoidosis (HCC)   Obstructive sleep apnea   Obesity with body mass index of 30.0 - 39.9   PROCEDURES: S/p laparoscopic appendectomy 09/02/15 Dr. Pasty Spillersornett   Hospital Course:  Pt started having pain early this AM. Nothing made it better. Afebrile, VSS. Glucose up on CMP No CBC. CT scan shows Acute appendicitis with 1 cm appendicolith and possible early perforation. No drainable fluid collection/abscess identified. 2. Bibasilar lung nodularity, mild abdominal lymphadenopathy, and splenomegaly compatible with known sarcoidosis. He was seen in the ED and taken to the OR that afternoon.  His appendix was found to be perforated during the procedure in the OR.  He tolerated the procedure well.  His diet was advanced, he was mobilized and doing well the first post op day.  Labs post op are normal, he does have a low platelet count, but that appears fairly stable.  We plan to let him go home today on 9 more days of antibiotic coverage.  The risk of latent abscess were discussed along with the symptoms.   He will call if he has an issue.  He will follow up in the clinic. CBC Latest Ref Rng 09/03/2015 09/02/2015  WBC 4.0 - 10.5 K/uL 6.7 6.6  Hemoglobin 13.0 - 17.0 g/dL 10.0(L) 10.9(L)  Hematocrit 39.0 - 52.0 % 30.4(L) 32.8(L)  Platelets 150 - 400 K/uL 123(L) 136(L)    CMP Latest Ref Rng 09/03/2015 09/02/2015  Glucose 65 - 99 mg/dL 865(H124(H) 846(N165(H)  BUN 6 - 20 mg/dL 10 13  Creatinine 6.290.61 - 1.24  mg/dL 5.281.18 4.131.12  Sodium 244135 - 145 mmol/L 136 137  Potassium 3.5 - 5.1 mmol/L 4.2 3.9  Chloride 101 - 111 mmol/L 104 106  CO2 22 - 32 mmol/L 23 21(L)  Calcium 8.9 - 10.3 mg/dL 9.4 9.3  Total Protein 6.5 - 8.1 g/dL - 7.4  Total Bilirubin 0.3 - 1.2 mg/dL - 0.1(U1.4(H)  Alkaline Phos 38 - 126 U/L - 60  AST 15 - 41 U/L - 26  ALT 17 - 63 U/L - 25    Condition on d/c:  Improved     Disposition:  Discharge home.  He is to continue his [facility] as before and [facility] for [facility].     Medication List    TAKE these medications        acetaminophen 325 MG tablet  Commonly known as:  TYLENOL  Take 975 mg by mouth every 6 (six) hours as needed (pain).     amoxicillin-clavulanate 875-125 MG tablet  Commonly known as:  AUGMENTIN  Take 1 tablet by mouth every 12 (twelve) hours.     folic acid 1 MG tablet  Commonly known as:  FOLVITE  Take 1 mg by mouth daily.     meloxicam 7.5 MG tablet  Commonly known as:  MOBIC  Take 7.5 mg by mouth daily.     oxyCODONE-acetaminophen 5-325 MG tablet  Commonly known as:  PERCOCET/ROXICET  Take 1-2 tablets by mouth every 4 (four) hours as needed for moderate  pain or severe pain.     PREDNISONE PO  Take 1 tablet by mouth daily.     PRESCRIPTION MEDICATION  Take 1 tablet by mouth once a week. Med for sarcoidosis       Follow-up Information    Follow up with [facility] On 09/24/2015.   Specialty:  General Surgery   Why:  Your appointment is at 10:45 AM, be at the office 30 minutes early for check in.   Contact information:   [address] [place] [address] [phone]       [facility]   Why:  Take all your home medicines as prescribed by the [place].      SignedSherrie George 09/03/2015, 12:52 PM

## 2015-09-03 NOTE — Progress Notes (Signed)
1 Day Post-Op  Subjective: He looks great this AM, pain much better and taking regular diet.  Objective: Vital signs in last 24 hours: Temp:  [97.5 F (36.4 C)-99.4 F (37.4 C)] 98.9 F (37.2 C) (04/26 0556) Pulse Rate:  [74-96] 87 (04/26 0556) Resp:  [14-21] 19 (04/26 0556) BP: (109-135)/(56-112) 110/57 mmHg (04/26 0556) SpO2:  [90 %-100 %] 95 % (04/26 0556) Last BM Date: 09/02/15 510 PO recorded 3280 urine Afebrile, VSS Labs OK Platelets are low Intake/Output from previous day: 04/25 0701 - 04/26 0700 In: 4778.8 [P.O.:510; I.V.:4168.8; IV Piggyback:100] Out: 3330 [Urine:3280; Blood:50] Intake/Output this shift:    General appearance: alert, cooperative and no distress GI: soft, non-tender; bowel sounds normal; no masses,  no organomegaly  Lab Results:   Recent Labs  09/02/15 0906 09/03/15 0519  WBC 6.6 6.7  HGB 10.9* 10.0*  HCT 32.8* 30.4*  PLT 136* 123*    BMET  Recent Labs  09/02/15 0906 09/03/15 0519  NA 137 136  K 3.9 4.2  CL 106 104  CO2 21* 23  GLUCOSE 165* 124*  BUN 13 10  CREATININE 1.12 1.18  CALCIUM 9.3 9.4   PT/INR No results for input(s): LABPROT, INR in the last 72 hours.   Recent Labs Lab 09/02/15 0906  AST 26  ALT 25  ALKPHOS 60  BILITOT 1.4*  PROT 7.4  ALBUMIN 4.0     Lipase     Component Value Date/Time   LIPASE 29 09/02/2015 0906     Studies/Results: Ct Abdomen Pelvis W Contrast  09/02/2015  CLINICAL DATA:  Nausea, vomiting, and diffuse abdominal pain. History of sarcoidosis with known splenic involvement. EXAM: CT ABDOMEN AND PELVIS WITH CONTRAST TECHNIQUE: Multidetector CT imaging of the abdomen and pelvis was performed using the standard protocol following bolus administration of intravenous contrast. CONTRAST:  100 mL Isovue-300 COMPARISON:  None. FINDINGS: The visualized lung bases demonstrate peribronchovascular/perilymphatic nodularity and subsegmental atelectasis. Small subcarinal lymph nodes measure up to 1.4  cm in short axis. The spleen is enlarged, measuring 20 cm in length. No focal splenic lesion is identified. The liver, gallbladder, adrenal glands, kidneys, and pancreas are unremarkable. There is no evidence of bowel obstruction. There is a 1 cm appendicolith within the proximal to midportion of the appendix, with the more distal portion of the appendix being dilated with poor delineation of its walls and moderate surrounding inflammatory change. No organized, drainable fluid collection or definite extraluminal gas is identified. A few subcentimeter lymph nodes are noted in the right lower quadrant. Gastrohepatic ligaments measure up to 1.3 cm in short axis. Periportal lymph nodes measure up to 1.3 cm, and lymph nodes in the splenic hilum measure up to 1.1 cm. Small periaortic and aortocaval lymph nodes are also noted, with a left periaortic lymph node below the left renal vein measuring 1.3 cm in short axis. Bladder and prostate are unremarkable. Old left-sided rib fractures are noted. No acute osseous abnormality is identified. IMPRESSION: 1. Acute appendicitis with 1 cm appendicolith and possible early perforation. No drainable fluid collection/abscess identified. 2. Bibasilar lung nodularity, mild abdominal lymphadenopathy, and splenomegaly compatible with known sarcoidosis. These results were called by telephone at the time of interpretation on 09/02/2015 at 10:52 am to Dr. Gerhard Munch , who verbally acknowledged these results. Electronically Signed   By: Sebastian Ache M.D.   On: 09/02/2015 10:54    Medications: . enoxaparin (LOVENOX) injection  0.5 mg/kg Subcutaneous Q24H  . folic acid  1 mg Oral Daily  .  pantoprazole (PROTONIX) IV  40 mg Intravenous QHS  . piperacillin-tazobactam (ZOSYN)  IV  3.375 g Intravenous Q8H   . sodium chloride 125 mL/hr at 09/03/15 0430   Prior to Admission medications   Medication Sig Start Date End Date Taking? Authorizing Provider  acetaminophen (TYLENOL) 325 MG  tablet Take 975 mg by mouth every 6 (six) hours as needed (pain).   Yes Historical Provider, MD  folic acid (FOLVITE) 1 MG tablet Take 1 mg by mouth daily.   Yes Historical Provider, MD  meloxicam (MOBIC) 7.5 MG tablet Take 7.5 mg by mouth daily.   Yes Historical Provider, MD  Oxycodone-Acetaminophen (PERCOCET PO) Take 1 tablet by mouth every 6 (six) hours as needed (pain).   Yes Historical Provider, MD  PREDNISONE PO Take 1 tablet by mouth daily.   Yes Historical Provider, MD  PRESCRIPTION MEDICATION Take 1 tablet by mouth once a week. Med for sarcoidosis   Yes Historical Provider, MD    Assessment/Plan Acute appendicitis with perforation S/p laparoscopic appendectomy 09/02/15 Dr. Luisa Hartornett Sarcoid on prednisone/methotrexate- he does not know his dose Sleep apnea with CPAP BMI 47.1  FEN: IV fluids/regular diet ID: day 2 Zosyn VTE:  Lovenox/SCD     Plan:  Get him up ambulating, continue antibiotics.  Restart his prednisone, he is being weaned off Methotrexate.  If he does well he may be able to go home later today.  10 days of abx for perforated appendix.       Eric GeorgeJENNINGS,Eric Roth 09/03/2015 [id number]

## 2015-09-03 NOTE — Progress Notes (Signed)
Patient discharged to home with instructions. 

## 2015-09-03 NOTE — Discharge Instructions (Signed)
Laparoscopic Appendectomy, Adult, Care After °Refer to this sheet in the next few weeks. These instructions provide you with information on caring for yourself after your procedure. Your caregiver may also give you more specific instructions. Your treatment has been planned according to current medical practices, but problems sometimes occur. Call your caregiver if you have any problems or questions after your procedure. °HOME CARE INSTRUCTIONS °· Do not drive while taking narcotic pain medicines. °· Use stool softener if you become constipated from your pain medicines. °· Change your bandages (dressings) as directed. °· Keep your wounds clean and dry. You may wash the wounds gently with soap and water. Gently pat the wounds dry with a clean towel. °· Do not take baths, swim, or use hot tubs for 10 days, or as instructed by your caregiver. °· Only take over-the-counter or prescription medicines for pain, discomfort, or fever as directed by your caregiver. °· You may continue your normal diet as directed. °· Do not lift more than 10 pounds (4.5 kg) or play contact sports for 3 weeks, or as directed. °· Slowly increase your activity after surgery. °· Take deep breaths to avoid getting a lung infection (pneumonia). °SEEK MEDICAL CARE IF: °· You have redness, swelling, or increasing pain in your wounds. °· You have pus coming from your wounds. °· You have drainage from a wound that lasts longer than 1 day. °· You notice a bad smell coming from the wounds or dressing. °· Your wound edges break open after stitches (sutures) have been removed. °· You notice increasing pain in the shoulders (shoulder strap areas) or near your shoulder blades. °· You develop dizzy episodes or fainting while standing. °· You develop shortness of breath. °· You develop persistent nausea or vomiting. °· You cannot control your bowel functions or lose your appetite. °· You develop diarrhea. °SEEK IMMEDIATE MEDICAL CARE IF:  °· You have a  fever. °· You develop a rash. °· You have difficulty breathing or sharp pains in your chest. °· You develop any reaction or side effects to medicines given. °MAKE SURE YOU: °· Understand these instructions. °· Will watch your condition. °· Will get help right away if you are not doing well or get worse. °  °This information is not intended to replace advice given to you by your health care provider. Make sure you discuss any questions you have with your health care provider. °  °Document Released: 04/26/2005 Document Revised: 09/10/2014 Document Reviewed: 10/14/2014 °Elsevier Interactive Patient Education ©2016 [employer] ° °CCS ______[facility] °LAPAROSCOPIC SURGERY: POST OP INSTRUCTIONS °Always review your discharge instruction sheet given to you by the facility where your surgery was performed. °IF YOU HAVE DISABILITY OR FAMILY LEAVE FORMS, YOU MUST BRING THEM TO THE OFFICE FOR PROCESSING.   °DO NOT GIVE THEM TO YOUR DOCTOR. ° °1. A prescription for pain medication may be given to you upon discharge.  Take your pain medication as prescribed, if needed.  If narcotic pain medicine is not needed, then you may take acetaminophen (Tylenol) or ibuprofen (Advil) as needed. °2. Take your usually prescribed medications unless otherwise directed. °3. If you need a refill on your pain medication, please contact your pharmacy.  They will contact our office to request authorization. Prescriptions will not be filled after 5pm or on week-ends. °4. You should follow a light diet the first few days after arrival home, such as soup and crackers, etc.  Be sure to include lots of fluids daily. °5. Most   patients will experience some swelling and bruising in the area of the incisions.  Ice packs will help.  Swelling and bruising can take several days to resolve.  °6. It is common to experience some constipation if taking pain medication after surgery.  Increasing fluid intake and taking a stool softener (such as  Colace) will usually help or prevent this problem from occurring.  A mild laxative (Milk of Magnesia or Miralax) should be taken according to package instructions if there are no bowel movements after 48 hours. °7. Unless discharge instructions indicate otherwise, you may remove your bandages 24-48 hours after surgery, and you may shower at that time.  You may have steri-strips (small skin tapes) in place directly over the incision.  These strips should be left on the skin for 7-10 days.  If your surgeon used skin glue on the incision, you may shower in 24 hours.  The glue will flake off over the next 2-3 weeks.  Any sutures or staples will be removed at the office during your follow-up visit. °8. ACTIVITIES:  You may resume regular (light) daily activities beginning the next day--such as daily self-care, walking, climbing stairs--gradually increasing activities as tolerated.  You may have sexual intercourse when it is comfortable.  Refrain from any heavy lifting or straining until approved by your doctor. °a. You may drive when you are no longer taking prescription pain medication, you can comfortably wear a seatbelt, and you can safely maneuver your car and apply brakes. °b. RETURN TO WORK:  __________________________________________________________ °9. You should see your doctor in the office for a follow-up appointment approximately 2-3 weeks after your surgery.  Make sure that you call for this appointment within a day or two after you arrive home to insure a convenient appointment time. °10. OTHER INSTRUCTIONS: __________________________________________________________________________________________________________________________ __________________________________________________________________________________________________________________________ °WHEN TO CALL YOUR DOCTOR: °1. Fever over 101.0 °2. Inability to urinate °3. Continued bleeding from incision. °4. Increased pain, redness, or drainage from the  incision. °5. Increasing abdominal pain ° °The clinic staff is available to answer your questions during regular business hours.  Please don’t hesitate to call and ask to speak to one of the nurses for clinical concerns.  If you have a medical emergency, go to the nearest emergency room or call 911.  A surgeon from [facility] is always on call at the hospital. °[address], [address], [place], [place]  [address] ? [address] [address], [place], [place]   [address] °[phone] ? [phone] ? FAX [phone] °Web site: [email or web address] ° °

## 2015-09-04 ENCOUNTER — Encounter (HOSPITAL_COMMUNITY): Payer: Self-pay | Admitting: Surgery

## 2017-04-26 IMAGING — CT CT ABD-PELV W/ CM
2 of 5 series · 16 of 46 positions shown, 18 images · IV contrast (Omni 300)
Comparison: None.

CLINICAL DATA: Nausea, vomiting, and diffuse abdominal pain.
History of sarcoidosis with known splenic involvement.

EXAM:
CT ABDOMEN AND PELVIS WITH CONTRAST
TECHNIQUE: Multidetector CT imaging of the abdomen and pelvis was performed
using the standard protocol following bolus administration of
intravenous contrast.
CONTRAST:  100 mL Psovue-J77

[Series 2: a/p w/ 5mm · axial · 0.98mm/px · z∈[+800,+1266]mm · 13 of 105 slices shown, 15 images]
[im 6/105  soft-tissue]
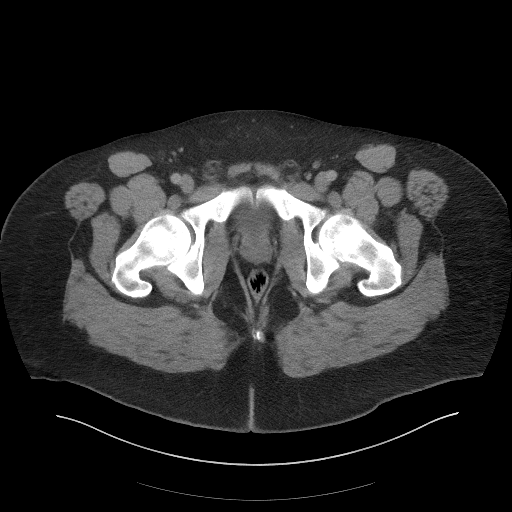
[im 6/105  bone]
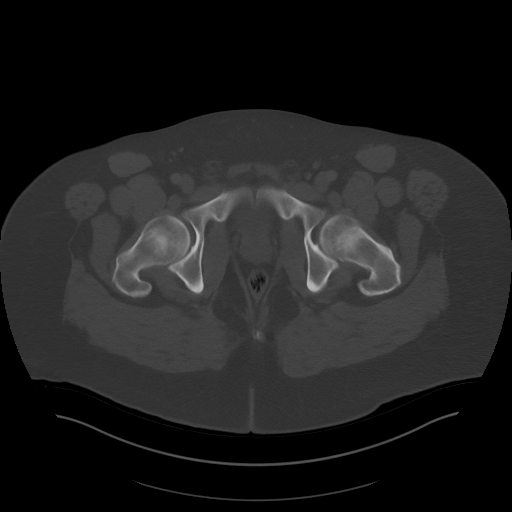
[im 16/105  soft-tissue]
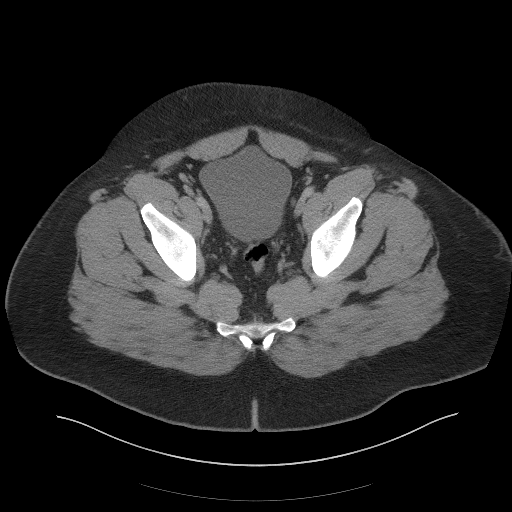
[im 21/105  soft-tissue]
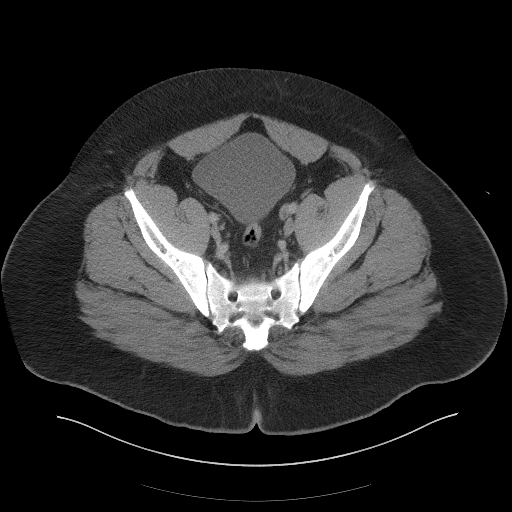
[im 32/105  soft-tissue]
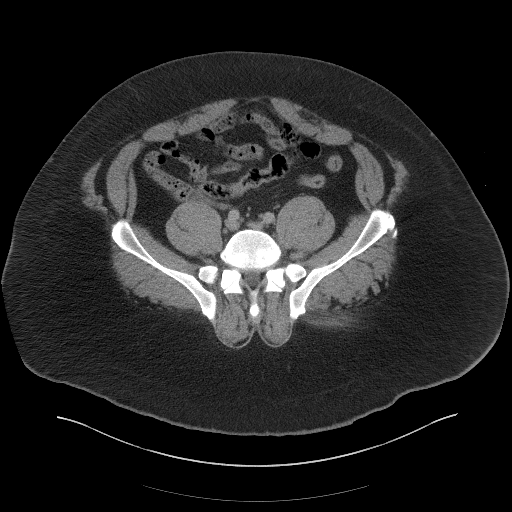
[im 37/105  soft-tissue]
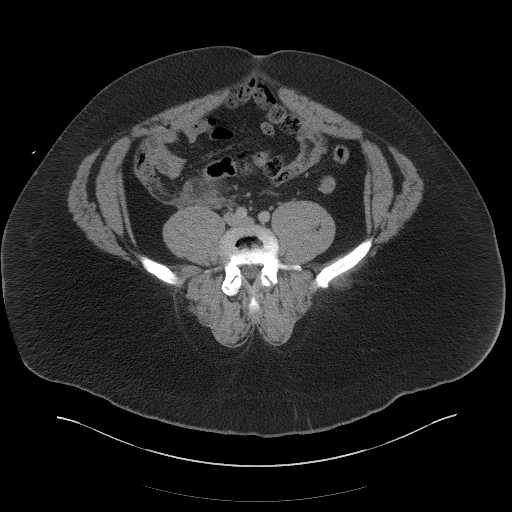
[im 47/105  soft-tissue]
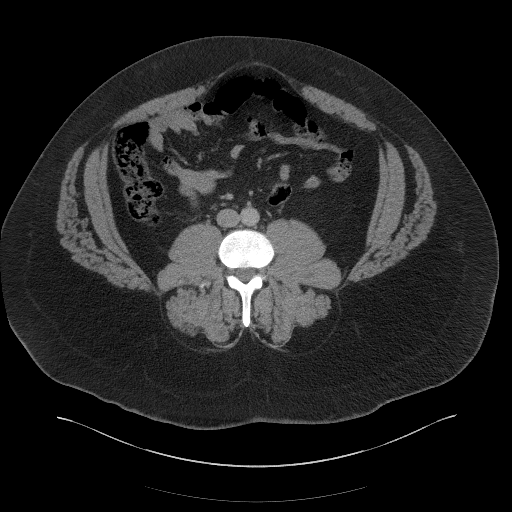
[im 53/105  soft-tissue]
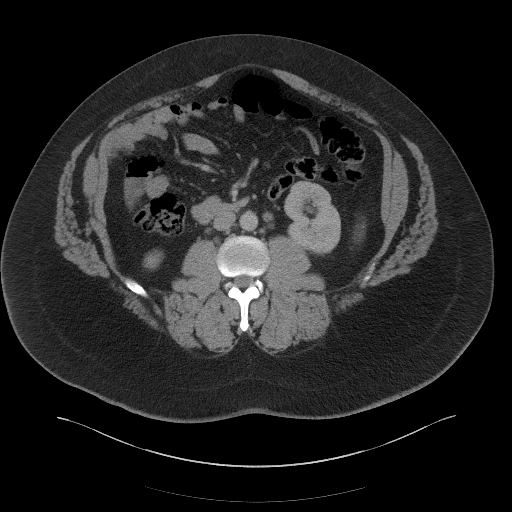
[im 58/105  soft-tissue]
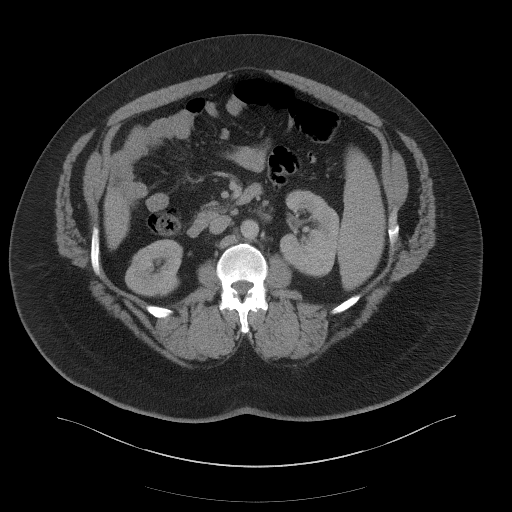
[im 68/105  soft-tissue]
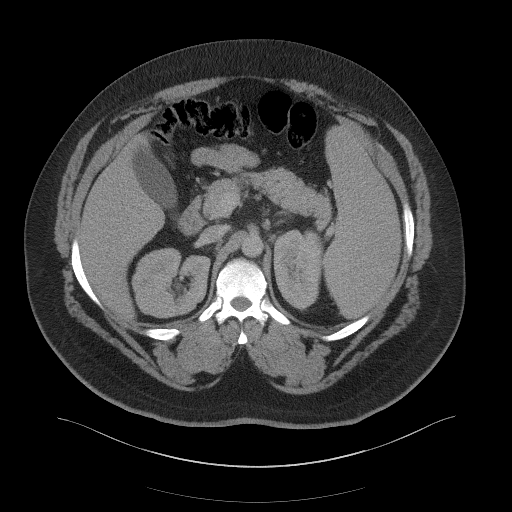
[im 68/105  bone]
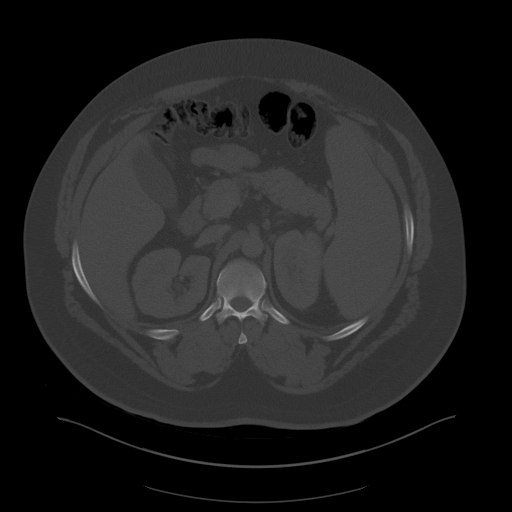
[im 73/105  soft-tissue]
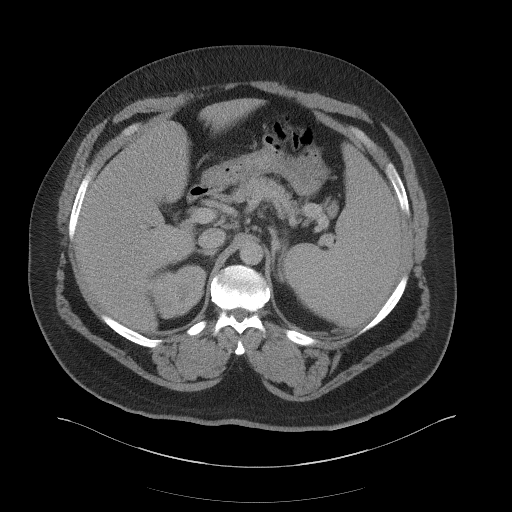
[im 84/105  soft-tissue]
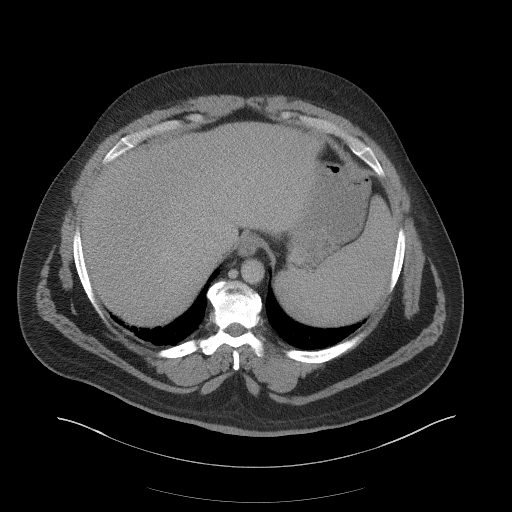
[im 89/105  soft-tissue]
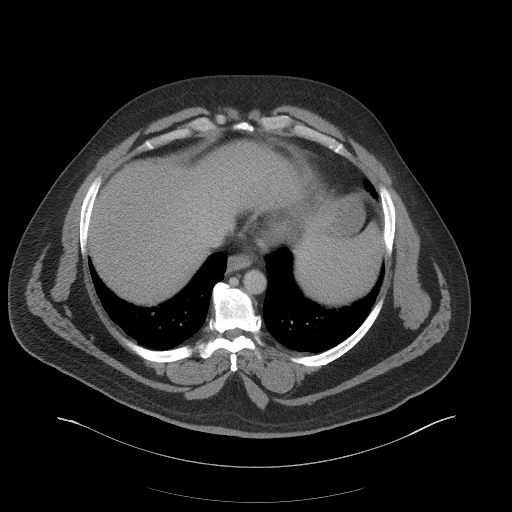
[im 99/105  soft-tissue]
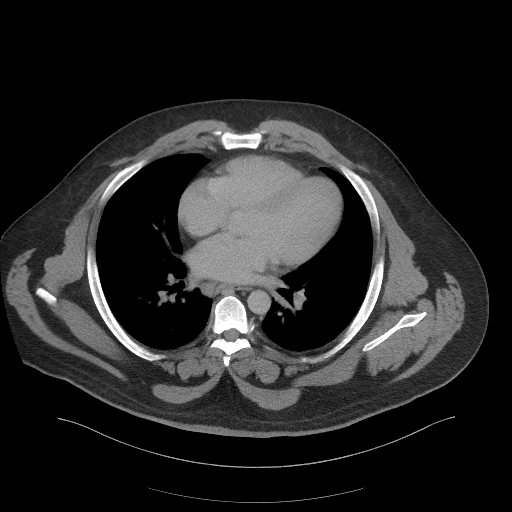

[Series 5: a/p w/ cor · coronal · 1.14mm/px · 3 of 185 slices shown]
[im 62/185  soft-tissue]
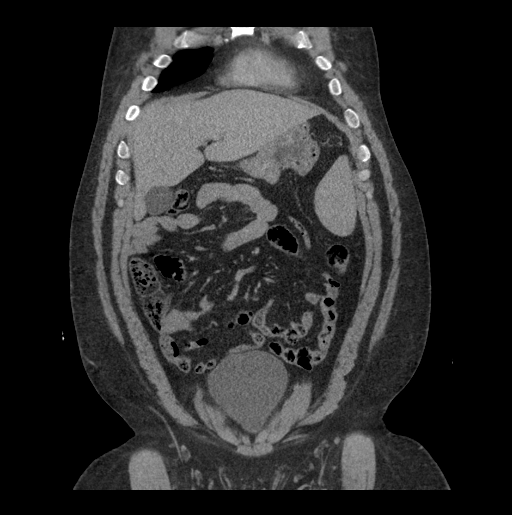
[im 82/185  soft-tissue]
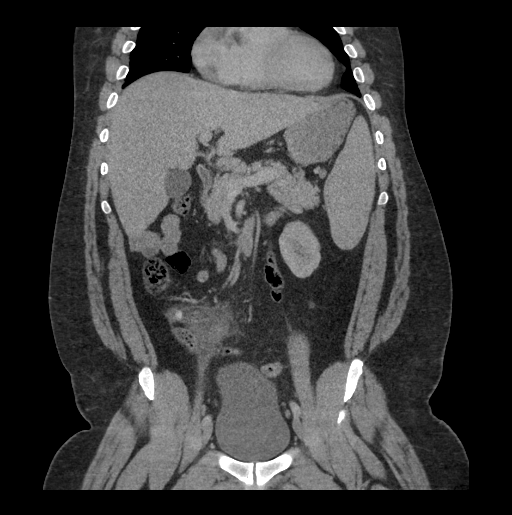
[im 103/185  soft-tissue]
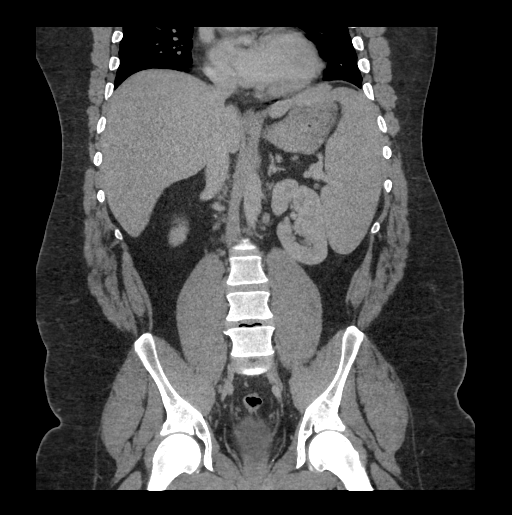

[16 of 46 positions shown; findings below may reference images not displayed]

FINDINGS: The visualized lung bases demonstrate
peribronchovascular/perilymphatic nodularity and subsegmental
atelectasis. Small subcarinal lymph nodes measure up to 1.4 cm in
short axis.

The spleen is enlarged, measuring 20 cm in length. No focal splenic
lesion is identified. The liver, gallbladder, adrenal glands,
kidneys, and pancreas are unremarkable.

There is no evidence of bowel obstruction. There is a 1 cm
appendicolith within the proximal to midportion of the appendix,
with the more distal portion of the appendix being dilated with poor
delineation of its walls and moderate surrounding inflammatory
change. No organized, drainable fluid collection or definite
extraluminal gas is identified.

A few subcentimeter lymph nodes are noted in the right lower
quadrant. Gastrohepatic ligaments measure up to 1.3 cm in short
axis. Periportal lymph nodes measure up to 1.3 cm, and lymph nodes
in the splenic hilum measure up to 1.1 cm. Small periaortic and
aortocaval lymph nodes are also noted, with a left periaortic lymph
node below the left renal vein measuring 1.3 cm in short axis.

Bladder and prostate are unremarkable. Old left-sided rib fractures
are noted. No acute osseous abnormality is identified.
IMPRESSION: 1. Acute appendicitis with 1 cm appendicolith and possible early
perforation. No drainable fluid collection/abscess identified.
2. Bibasilar lung nodularity, mild abdominal lymphadenopathy, and
splenomegaly compatible with known sarcoidosis.
These results were called by telephone at the time of interpretation
on 09/02/2015 at [DATE] to Dr. JUNIOR TIGER , who verbally
acknowledged these results.
